# Patient Record
Sex: Male | Born: 1985 | Race: White | Hispanic: No | Smoking: Current every day smoker
Health system: Southern US, Community
[De-identification: ages and names within clinical notes are randomized; demographics above are authoritative.]

## PROBLEM LIST (undated history)

## (undated) DIAGNOSIS — I219 Acute myocardial infarction, unspecified: Secondary | ICD-10-CM

## (undated) HISTORY — PX: EYE SURGERY: SHX253

---

## 2014-10-12 ENCOUNTER — Encounter (HOSPITAL_COMMUNITY): Payer: Self-pay | Admitting: Emergency Medicine

## 2014-10-12 ENCOUNTER — Emergency Department (HOSPITAL_COMMUNITY)
Admission: EM | Admit: 2014-10-12 | Discharge: 2014-10-12 | Disposition: A | Discharge status: Home / Self Care | Payer: Self-pay | Attending: Emergency Medicine | Admitting: Emergency Medicine

## 2014-10-12 DIAGNOSIS — H109 Unspecified conjunctivitis: Secondary | ICD-10-CM | POA: Insufficient documentation

## 2014-10-12 DIAGNOSIS — Z720 Tobacco use: Secondary | ICD-10-CM | POA: Insufficient documentation

## 2014-10-12 DIAGNOSIS — Z7982 Long term (current) use of aspirin: Secondary | ICD-10-CM | POA: Insufficient documentation

## 2014-10-12 DIAGNOSIS — Z792 Long term (current) use of antibiotics: Secondary | ICD-10-CM | POA: Insufficient documentation

## 2014-10-12 DIAGNOSIS — I252 Old myocardial infarction: Secondary | ICD-10-CM | POA: Insufficient documentation

## 2014-10-12 DIAGNOSIS — Z79899 Other long term (current) drug therapy: Secondary | ICD-10-CM | POA: Insufficient documentation

## 2014-10-12 HISTORY — DX: Acute myocardial infarction, unspecified: I21.9

## 2014-10-12 MED ORDER — IBUPROFEN 800 MG PO TABS: 800.0000 mg | ORAL_TABLET | Freq: Three times a day (TID) | ORAL | Status: DC

## 2014-10-12 MED ORDER — TOBRAMYCIN 0.3 % OP SOLN
2.0000 [drp] | Freq: Once | OPHTHALMIC | Status: AC
  Administered 2014-10-12: 2 [drp] via OPHTHALMIC
  Filled 2014-10-12: qty 5

## 2014-10-12 MED ORDER — FLUORESCEIN SODIUM 1 MG OP STRP
1.0000 | ORAL_STRIP | Freq: Once | OPHTHALMIC | Status: AC
  Administered 2014-10-12: 1 via OPHTHALMIC
  Filled 2014-10-12: qty 1

## 2014-10-12 MED ORDER — HYDROCODONE-ACETAMINOPHEN 5-325 MG PO TABS: 1.0000 | ORAL_TABLET | ORAL | Status: DC | PRN

## 2014-10-12 MED ORDER — IBUPROFEN 800 MG PO TABS
800.0000 mg | ORAL_TABLET | Freq: Once | ORAL | Status: AC
  Administered 2014-10-12: 800 mg via ORAL
  Filled 2014-10-12: qty 1

## 2014-10-12 MED ORDER — ACETAMINOPHEN 500 MG PO TABS
500.0000 mg | ORAL_TABLET | Freq: Once | ORAL | Status: AC
  Administered 2014-10-12: 500 mg via ORAL
  Filled 2014-10-12: qty 1

## 2014-10-12 NOTE — ED Provider Notes (Signed)
CSN: [id number]     Arrival date & time 10/12/14  1330 History   First MD Initiated Contact with Patient 10/12/14 1555     Chief Complaint  Patient presents with  . Eye Pain     (Consider location/radiation/quality/duration/timing/severity/associated sxs/prior Treatment) HPI Comments: Patient is a 29 year old male who presents to the emergency department with a complaint of left eye pain. The patient states that he slept with a contact in his eye on last night, this morning he awakened to find that there was swelling of the eye, and soreness around the left eye. 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 The patient was able to get the contact out, but he continues to have pain. He particularly has pain when he has a change in the brightness of lites. He presents at this time for assistance with this discomfort in for evaluation of his eye.  Patient is a 29 y.o. male presenting with eye pain. The history is provided by the patient.  Eye Pain This is a new problem. Pertinent negatives include no abdominal pain, arthralgias, chest pain, coughing or neck pain.    Past Medical History  Diagnosis Date  . MI (myocardial infarction)     Dec 2015   Past Surgical History  Procedure Laterality Date  . Eye surgery     No family history on file. History  Substance Use Topics  . Smoking status: Current Every Day Smoker -- 0.20 packs/day    Types: Cigarettes  . Smokeless tobacco: Not on file  . Alcohol Use: No    Review of Systems  Constitutional: Negative for activity change.       All ROS Neg except as noted in HPI  HENT: Negative for nosebleeds.   Eyes: Positive for pain. Negative for photophobia and discharge.  Respiratory: Negative for cough, shortness of breath and wheezing.   Cardiovascular: Negative for chest pain and palpitations.  Gastrointestinal: Negative for abdominal pain and blood in stool.  Genitourinary: Negative for dysuria, frequency and hematuria.   Musculoskeletal: Negative for back pain, arthralgias and neck pain.  Skin: Negative.   Neurological: Negative for dizziness, seizures and speech difficulty.  Psychiatric/Behavioral: Negative for hallucinations and confusion.      Allergies  Review of patient's allergies indicates no known allergies.  Home Medications   Prior to Admission medications   Medication Sig Start Date End Date Taking? Authorizing Provider  aspirin EC 81 MG tablet Take 81 mg by mouth daily.   Yes Historical Provider, MD  levofloxacin (LEVAQUIN) 500 MG tablet Take 500 mg by mouth daily.   Yes Historical Provider, MD  metoprolol tartrate (LOPRESSOR) 25 MG tablet Take 25 mg by mouth 2 (two) times daily.   Yes Historical Provider, MD   BP 118/71 mmHg  Pulse 97  Temp(Src) 98.3 F (36.8 C) (Oral)  Resp 20  Ht 6' (1.829 m)  Wt 140 lb (63.504 kg)  BMI 18.98 kg/m2  SpO2 100% Physical Exam  Constitutional: He is oriented to person, place, and time. He appears well-developed and well-nourished.  Non-toxic appearance.  HENT:  Head: Normocephalic.  Right Ear: Tympanic membrane and external ear normal.  Left Ear: Tympanic membrane and external ear normal.  Eyes: EOM and lids are normal. Pupils are equal, round, and reactive to light.  There is soreness in the periorbital area of the left eye. The anterior chamber on the left is clear. The conjunctiva is injected. The bulbar conjunctiva is injected. The extra can  movement is intact. Limited ophthalmic examination due to pain, but no hemorrhage, or papillary edema appreciated. The globe is intact and firm.  Patient was examined with fluoroscopy seen and slit-lamp. No corneal abrasion appreciated. No foreign body appreciated.  Neck: Normal range of motion. Neck supple. Carotid bruit is not present.  Cardiovascular: Normal rate, regular rhythm, normal heart sounds, intact distal pulses and normal pulses.   Pulmonary/Chest: Breath sounds normal. No respiratory  distress.  Abdominal: Soft. Bowel sounds are normal. There is no tenderness. There is no guarding.  Musculoskeletal: Normal range of motion.  Lymphadenopathy:       Head (right side): No submandibular adenopathy present.       Head (left side): No submandibular adenopathy present.    He has no cervical adenopathy.  Neurological: He is alert and oriented to person, place, and time. He has normal strength. No cranial nerve deficit or sensory deficit.  Skin: Skin is warm and dry.  Psychiatric: He has a normal mood and affect. His speech is normal.  Nursing note and vitals reviewed.   ED Course  Procedures (including critical care time) Labs Review Labs Reviewed - No data to display  Imaging Review No results found.   EKG Interpretation None      MDM  The examination is consistent with conjunctivitis of the left eye. Patient is a been advised to white count surfaces and keep his distance from others. He has been treated with tobramycin ophthalmic drops, as well as ibuprofen and Norco for pain and headache. Patient is advised to use a cool compress to the eye as well as he will return if any changes, problems, or concerns.    Final diagnoses:  Conjunctivitis of left eye    *I have reviewed nursing notes, vital signs, and all appropriate lab and imaging results for this patient.[address] PA-C 10/12/14 1722  Rolland Porter, MD 10/16/14 [phone]

## 2014-10-12 NOTE — Discharge Instructions (Signed)
Please use a cool compress to your left eye. Please wash hands frequently. This condition is contagious, please wipe surfaces that you touch. Please keep your distance from others. Please use ibuprofen 3 times daily, and use 2 drops of your eye drops every 4 hours (tobramycin) for the next 5 days. Conjunctivitis Conjunctivitis is commonly called "pink eye." Conjunctivitis can be caused by bacterial or viral infection, allergies, or injuries. There is usually redness of the lining of the eye, itching, discomfort, and sometimes discharge. There may be deposits of matter along the eyelids. A viral infection usually causes a watery discharge, while a bacterial infection causes a yellowish, thick discharge. Pink eye is very contagious and spreads by direct contact. You may be given antibiotic eyedrops as part of your treatment. Before using your eye medicine, remove all drainage from the eye by washing gently with warm water and cotton balls. Continue to use the medication until you have awakened 2 mornings in a row without discharge from the eye. Do not rub your eye. This increases the irritation and helps spread infection. Use separate towels from other household members. Wash your hands with soap and water before and after touching your eyes. Use cold compresses to reduce pain and sunglasses to relieve irritation from light. Do not wear contact lenses or wear eye makeup until the infection is gone. SEEK MEDICAL CARE IF:   Your symptoms are not better after 3 days of treatment.  You have increased pain or trouble seeing.  The outer eyelids become very red or swollen. Document Released: 08/29/2004 Document Revised: 10/14/2011 Document Reviewed: 07/22/2005 [facility] Patient Information 2015 [place], [place]. This information is not intended to replace advice given to you by your health care provider. Make sure you discuss any questions you have with your health care provider.

## 2014-10-12 NOTE — ED Notes (Signed)
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

## 2014-12-18 ENCOUNTER — Observation Stay (HOSPITAL_COMMUNITY)
Admission: EM | Admit: 2014-12-18 | Discharge: 2014-12-19 | Disposition: A | Discharge status: Home / Self Care | Payer: Self-pay | Attending: Internal Medicine | Admitting: Internal Medicine

## 2014-12-18 ENCOUNTER — Encounter (HOSPITAL_COMMUNITY): Payer: Self-pay | Admitting: Emergency Medicine

## 2014-12-18 DIAGNOSIS — F17210 Nicotine dependence, cigarettes, uncomplicated: Secondary | ICD-10-CM | POA: Insufficient documentation

## 2014-12-18 DIAGNOSIS — J9601 Acute respiratory failure with hypoxia: Secondary | ICD-10-CM | POA: Diagnosis present

## 2014-12-18 DIAGNOSIS — R0902 Hypoxemia: Secondary | ICD-10-CM

## 2014-12-18 DIAGNOSIS — T50901A Poisoning by unspecified drugs, medicaments and biological substances, accidental (unintentional), initial encounter: Secondary | ICD-10-CM | POA: Diagnosis present

## 2014-12-18 DIAGNOSIS — T424X1A Poisoning by benzodiazepines, accidental (unintentional), initial encounter: Principal | ICD-10-CM | POA: Insufficient documentation

## 2014-12-18 DIAGNOSIS — F1910 Other psychoactive substance abuse, uncomplicated: Secondary | ICD-10-CM

## 2014-12-18 DIAGNOSIS — I252 Old myocardial infarction: Secondary | ICD-10-CM | POA: Insufficient documentation

## 2014-12-18 DIAGNOSIS — G9340 Encephalopathy, unspecified: Secondary | ICD-10-CM

## 2014-12-18 LAB — I-STAT VENOUS BLOOD GAS, ED
Acid-Base Excess: 2 mmol/L (ref 0.0–2.0)
Bicarbonate: 28.8 meq/L — ABNORMAL HIGH (ref 20.0–24.0)
O2 Saturation: 86 %
TCO2: 30 mmol/L (ref 0–100)
pCO2, Ven: 53.2 mmHg — ABNORMAL HIGH (ref 45.0–50.0)
pH, Ven: 7.341 — ABNORMAL HIGH (ref 7.250–7.300)
pO2, Ven: 56 mmHg — ABNORMAL HIGH (ref 30.0–45.0)

## 2014-12-18 LAB — CBC WITH DIFFERENTIAL/PLATELET
Basophils Absolute: 0 10*3/uL (ref 0.0–0.1)
Basophils Relative: 0 % (ref 0–1)
Eosinophils Absolute: 0.2 10*3/uL (ref 0.0–0.7)
Eosinophils Relative: 2 % (ref 0–5)
HCT: 46.7 % (ref 39.0–52.0)
Hemoglobin: 16.2 g/dL (ref 13.0–17.0)
Lymphocytes Relative: 22 % (ref 12–46)
Lymphs Abs: 2.5 10*3/uL (ref 0.7–4.0)
MCH: 32 pg (ref 26.0–34.0)
MCHC: 34.7 g/dL (ref 30.0–36.0)
MCV: 92.3 fL (ref 78.0–100.0)
Monocytes Absolute: 0.8 10*3/uL (ref 0.1–1.0)
Monocytes Relative: 7 % (ref 3–12)
Neutro Abs: 7.9 10*3/uL — ABNORMAL HIGH (ref 1.7–7.7)
Neutrophils Relative %: 69 % (ref 43–77)
Platelets: 196 10*3/uL (ref 150–400)
RBC: 5.06 MIL/uL (ref 4.22–5.81)
RDW: 14.8 % (ref 11.5–15.5)
WBC: 11.3 10*3/uL — ABNORMAL HIGH (ref 4.0–10.5)

## 2014-12-18 LAB — URINE MICROSCOPIC-ADD ON

## 2014-12-18 LAB — URINALYSIS, ROUTINE W REFLEX MICROSCOPIC
Glucose, UA: NEGATIVE mg/dL
Hgb urine dipstick: NEGATIVE
KETONES UR: 15 mg/dL — AB
Leukocytes, UA: NEGATIVE
NITRITE: NEGATIVE
Protein, ur: NEGATIVE mg/dL
SPECIFIC GRAVITY, URINE: 1.024 (ref 1.005–1.030)
Urobilinogen, UA: 0.2 mg/dL (ref 0.0–1.0)
pH: 5 (ref 5.0–8.0)

## 2014-12-18 LAB — COMPREHENSIVE METABOLIC PANEL
ALBUMIN: 3.8 g/dL (ref 3.5–5.0)
ALT: 13 U/L — ABNORMAL LOW (ref 17–63)
ANION GAP: 10 (ref 5–15)
AST: 18 U/L (ref 15–41)
Alkaline Phosphatase: 69 U/L (ref 38–126)
BUN: 11 mg/dL (ref 6–20)
CO2: 26 mmol/L (ref 22–32)
Calcium: 8.9 mg/dL (ref 8.9–10.3)
Chloride: 102 mmol/L (ref 101–111)
Creatinine, Ser: 1.02 mg/dL (ref 0.61–1.24)
GFR calc non Af Amer: >60 mL/min (ref >60)
Glucose, Bld: 85 mg/dL (ref 65–99)
Potassium: 3.5 mmol/L (ref 3.5–5.1)
Sodium: 138 mmol/L (ref 135–145)
Total Bilirubin: 0.8 mg/dL (ref 0.3–1.2)
Total Protein: 6.1 g/dL — ABNORMAL LOW (ref 6.5–8.1)

## 2014-12-18 LAB — RAPID URINE DRUG SCREEN, HOSP PERFORMED
Amphetamines: NOT DETECTED
Barbiturates: NOT DETECTED
Benzodiazepines: POSITIVE — AB
Cocaine: NOT DETECTED
Opiates: NOT DETECTED
Tetrahydrocannabinol: POSITIVE — AB

## 2014-12-18 LAB — I-STAT CG4 LACTIC ACID, ED
Lactic Acid, Venous: 0.64 mmol/L (ref 0.5–2.0)
Lactic Acid, Venous: 0.67 mmol/L (ref 0.5–2.0)

## 2014-12-18 MED ORDER — SODIUM CHLORIDE 0.9 % IV BOLUS (SEPSIS)
1000.0000 mL | Freq: Once | INTRAVENOUS | Status: AC
  Administered 2014-12-18: 1000 mL via INTRAVENOUS

## 2014-12-18 MED ORDER — SODIUM CHLORIDE 0.9 % IV SOLN
INTRAVENOUS | Status: DC
  Administered 2014-12-19: via INTRAVENOUS

## 2014-12-18 NOTE — ED Notes (Signed)
Douglas [name] [phone] Emergency Contact

## 2014-12-18 NOTE — ED Notes (Signed)
Xanax found on patient.  Changed story multiple times during triage.  States I took xanax, then states I took methadone.  Per EMS friends called them because he wasn't right.  Alert and answering questions but story continually changes.

## 2014-12-18 NOTE — ED Provider Notes (Signed)
CSN: [id number]     Arrival date & time 12/18/14  1924 History   First MD Initiated Contact with Patient 12/18/14 2007     Chief Complaint  Patient presents with  . Altered Mental Status   Douglas [name] is a 29 y.o. male with a past medical history significant for myocardial infarction who presents for altered mental status. The patient reports that he "drank some beer", took 2 pills of Xanax, and took 3 of methadone earlier this afternoon. The patient says that he has no other complaints specifically denying headache, vision changes, nausea, vomiting, constipation, diarrhea, dysuria, rashes, Chest pain, or shortness of breath. The patient denies any complaints. On initial history gathering, the patient is sleepy and drifted off to sleep during conversation.   (Consider location/radiation/quality/duration/timing/severity/associated sxs/prior Treatment) Patient is a 29 y.o. male presenting with Overdose. The history is provided by the patient. No language interpreter was used.  Drug Overdose This is a new problem. The problem occurs constantly. The problem has been unchanged. Associated symptoms include fatigue. Pertinent negatives include no abdominal pain, chest pain, chills, congestion, coughing, diaphoresis, fever, headaches, nausea, neck pain, vomiting or weakness. Associated symptoms comments: Lethargy . He has tried nothing for the symptoms. The treatment provided no relief.    Past Medical History  Diagnosis Date  . MI (myocardial infarction)     Dec 2015   Past Surgical History  Procedure Laterality Date  . Eye surgery     No family history on file. History  Substance Use Topics  . Smoking status: Current Every Day Smoker -- 0.20 packs/day    Types: Cigarettes  . Smokeless tobacco: Not on file  . Alcohol Use: No    Review of Systems  Constitutional: Positive for fatigue. Negative for fever, chills and diaphoresis.  HENT: Negative for congestion and rhinorrhea.   Eyes:  Negative for visual disturbance.  Respiratory: Negative for cough, choking, chest tightness, shortness of breath, wheezing and stridor.   Cardiovascular: Negative for chest pain and palpitations.  Gastrointestinal: Negative for nausea, vomiting, abdominal pain, diarrhea and constipation.  Musculoskeletal: Negative for back pain and neck pain.  Skin: Negative for wound.  Neurological: Negative for dizziness, weakness, light-headedness and headaches.  All other systems reviewed and are negative.     Allergies  Review of patient's allergies indicates no known allergies.  Home Medications   Prior to Admission medications   Medication Sig Start Date End Date Taking? Authorizing Provider  HYDROcodone-acetaminophen (NORCO/VICODIN) 5-325 MG per tablet Take 1-2 tablets by mouth every 4 (four) hours as needed. 10/12/14   Ivery QualeHobson Bryant, PA-C  ibuprofen (ADVIL,MOTRIN) 800 MG tablet Take 1 tablet (800 mg total) by mouth 3 (three) times daily. 10/12/14   Ivery QualeHobson Bryant, PA-C   BP 133/70 mmHg  Pulse 97  Temp(Src) 98.4 F (36.9 C)  Resp 18  Ht 6' (1.829 m)  Wt 140 lb (63.504 kg)  BMI 18.98 kg/m2  SpO2 100% Physical Exam  Constitutional: He is oriented to person, place, and time. He appears well-developed and well-nourished. He appears lethargic. No distress.  HENT:  Head: Normocephalic and atraumatic.  Mouth/Throat: No oropharyngeal exudate.  Eyes: Conjunctivae and EOM are normal. Pupils are equal, round, and reactive to light.  Neck: Normal range of motion.  Cardiovascular: Normal rate, regular rhythm, normal heart sounds and intact distal pulses.   No murmur heard. Pulmonary/Chest: Effort normal and breath sounds normal. No stridor. No respiratory distress. He has no wheezes. He exhibits no tenderness.  Abdominal: Soft.  There is no tenderness.  Musculoskeletal: Normal range of motion. He exhibits no tenderness.  Neurological: He is oriented to person, place, and time. He appears lethargic. He  is not disoriented. He displays normal reflexes. No cranial nerve deficit or sensory deficit. He exhibits normal muscle tone. Coordination normal. GCS eye subscore is 3. GCS verbal subscore is 5. GCS motor subscore is 6.  Skin: Skin is warm. He is not diaphoretic. No pallor.  Nursing note and vitals reviewed.   ED Course  Procedures (including critical care time) Labs Review Labs Reviewed  CBC WITH DIFFERENTIAL/PLATELET - Abnormal; Notable for the following:    WBC 11.3 (*)    Neutro Abs 7.9 (*)    All other components within normal limits  COMPREHENSIVE METABOLIC PANEL - Abnormal; Notable for the following:    Total Protein 6.1 (*)    ALT 13 (*)    All other components within normal limits  URINE RAPID DRUG SCREEN (HOSP PERFORMED) - Abnormal; Notable for the following:    Benzodiazepines POSITIVE (*)    Tetrahydrocannabinol POSITIVE (*)    All other components within normal limits  URINALYSIS, ROUTINE W REFLEX MICROSCOPIC - Abnormal; Notable for the following:    Color, Urine BROWN (*)    APPearance TURBID (*)    Bilirubin Urine SMALL (*)    Ketones, ur 15 (*)    All other components within normal limits  I-STAT VENOUS BLOOD GAS, ED - Abnormal; Notable for the following:    pH, Ven 7.341 (*)    pCO2, Ven 53.2 (*)    pO2, Ven 56.0 (*)    Bicarbonate 28.8 (*)    All other components within normal limits  URINE CULTURE  MRSA PCR SCREENING  URINE MICROSCOPIC-ADD ON  ETHANOL  ACETAMINOPHEN LEVEL  SALICYLATE LEVEL  D-DIMER, QUANTITATIVE  BASIC METABOLIC PANEL  CBC  I-STAT CG4 LACTIC ACID, ED  I-STAT CG4 LACTIC ACID, ED    Imaging Review No results found.   EKG Interpretation   Date/Time:  Sunday Dec 18 2014 19:35:32 EDT Ventricular Rate:  85 PR Interval:  127 QRS Duration: 95 QT Interval:  349 QTC Calculation: 415 R Axis:   79 Text Interpretation:  Sinus rhythm Borderline repolarization abnormality  No old tracing to compare Confirmed by Rhunette CroftNANAVATI, MD, Janey GentaANKIT  [phone]) on  12/18/2014 7:56:21 PM      MDM   Final diagnoses:  Hypoxemia   Douglas RiffleKyle [name] is a 29 y.o. male with a past medical history significant for myocardial infarction who presents for altered mental status and concern for accidental overdose. The patient reports that he drank P her, use Xanax, and took methadone earlier today. The patient is somnolent however, he is arousable with vigorous stimulation and yelling. When the patient is asleep, his oxygen saturations desaturated into the 80s on room air. After 2 L nasal cannula was placed, the patient maintained his oxygen saturation even while asleep. The patient had diagnostic laboratory workup to investigate for source of his lethargy and altered mental status.  The patient's initial alcohol level was negative, his lactic acid was normal, his CBC showed a mild leukocytosis of 11.3 but a normal hemoglobin. The patient's CMP was unremarkable and his UDS was positive for benzos and marijuana. The patient's urinalysis did not show evidence of infection.  The patient denied any complaints on review of systems. The patient denied any other symptoms aside from being sleepy.  The patient was observed for over 4 hours in the emergency department and  continued to have oxygen saturations when on room air. At this point, the patient was felt most appropriate to continue metabolizing in the hospital to ensure no hypoxia. Prior to admission, a chest x-ray was obtained and, in bedside review, there did not appear to be any focal abnormality.   The patient agreed with the plan of care, had no other problems, and was admitted in stable condition.   This patient was seen with Dr. Rhunette Croft, ED attending.     Theda Belfast, MD 12/19/14 1610  Derwood Kaplan, MD 12/27/14 [id number]

## 2014-12-18 NOTE — ED Notes (Signed)
Patient falls asleep rapidly.  Oxygen sat falls to low 80's.  O2 placed via [place] at 2L.  Provider aware.

## 2014-12-19 ENCOUNTER — Observation Stay (HOSPITAL_COMMUNITY): Payer: Self-pay

## 2014-12-19 ENCOUNTER — Encounter (HOSPITAL_COMMUNITY): Payer: Self-pay | Admitting: Internal Medicine

## 2014-12-19 DIAGNOSIS — J9601 Acute respiratory failure with hypoxia: Secondary | ICD-10-CM | POA: Diagnosis present

## 2014-12-19 DIAGNOSIS — T50901D Poisoning by unspecified drugs, medicaments and biological substances, accidental (unintentional), subsequent encounter: Secondary | ICD-10-CM

## 2014-12-19 DIAGNOSIS — T50901A Poisoning by unspecified drugs, medicaments and biological substances, accidental (unintentional), initial encounter: Secondary | ICD-10-CM | POA: Insufficient documentation

## 2014-12-19 LAB — CBC
HEMATOCRIT: 47.6 % (ref 39.0–52.0)
HEMOGLOBIN: 16 g/dL (ref 13.0–17.0)
MCH: 31.3 pg (ref 26.0–34.0)
MCHC: 33.6 g/dL (ref 30.0–36.0)
MCV: 93.2 fL (ref 78.0–100.0)
PLATELETS: 190 10*3/uL (ref 150–400)
RBC: 5.11 MIL/uL (ref 4.22–5.81)
RDW: 14.9 % (ref 11.5–15.5)
WBC: 8.6 10*3/uL (ref 4.0–10.5)

## 2014-12-19 LAB — BASIC METABOLIC PANEL
Anion gap: 7 (ref 5–15)
BUN: 9 mg/dL (ref 6–20)
CHLORIDE: 105 mmol/L (ref 101–111)
CO2: 26 mmol/L (ref 22–32)
Calcium: 8.3 mg/dL — ABNORMAL LOW (ref 8.9–10.3)
Creatinine, Ser: 0.89 mg/dL (ref 0.61–1.24)
GFR calc Af Amer: >60 mL/min (ref >60)
GFR calc non Af Amer: >60 mL/min (ref >60)
Glucose, Bld: 82 mg/dL (ref 65–99)
Potassium: 3.8 mmol/L (ref 3.5–5.1)
SODIUM: 138 mmol/L (ref 135–145)

## 2014-12-19 LAB — MRSA PCR SCREENING: MRSA by PCR: NEGATIVE

## 2014-12-19 LAB — D-DIMER, QUANTITATIVE (NOT AT ARMC)

## 2014-12-19 LAB — SALICYLATE LEVEL: Salicylate Lvl: <4 mg/dL (ref 2.8–30.0)

## 2014-12-19 LAB — ETHANOL: Alcohol, Ethyl (B): <5 mg/dL

## 2014-12-19 LAB — ACETAMINOPHEN LEVEL: Acetaminophen (Tylenol), Serum: <10 ug/mL — ABNORMAL LOW (ref 10–30)

## 2014-12-19 MED ORDER — ENOXAPARIN SODIUM 40 MG/0.4ML ~~LOC~~ SOLN
40.0000 mg | Freq: Every day | SUBCUTANEOUS | Status: DC
  Administered 2014-12-19: 40 mg via SUBCUTANEOUS
  Filled 2014-12-19: qty 0.4

## 2014-12-19 MED ORDER — SODIUM CHLORIDE 0.9 % IV SOLN
INTRAVENOUS | Status: DC
  Administered 2014-12-19 (×2): via INTRAVENOUS

## 2014-12-19 MED ORDER — ONDANSETRON HCL 4 MG/2ML IJ SOLN: 4.0000 mg | Freq: Four times a day (QID) | INTRAMUSCULAR | Status: DC | PRN

## 2014-12-19 MED ORDER — ONDANSETRON HCL 4 MG PO TABS: 4.0000 mg | ORAL_TABLET | Freq: Four times a day (QID) | ORAL | Status: DC | PRN

## 2014-12-19 NOTE — Discharge Instructions (Signed)
Alcohol Intoxication  Alcohol intoxication occurs when the amount of alcohol that a person has consumed impairs his or her ability to mentally and physically function. Alcohol directly impairs the normal chemical activity of the brain. Drinking large amounts of alcohol can lead to changes in mental function and behavior, and it can cause many physical effects that can be harmful.   Alcohol intoxication can range in severity from mild to very severe. Various factors can affect the level of intoxication that occurs, such as the person's age, gender, weight, frequency of alcohol consumption, and the presence of other medical conditions (such as diabetes, seizures, or heart conditions). Dangerous levels of alcohol intoxication may occur when people drink large amounts of alcohol in a short period (binge drinking). Alcohol can also be especially dangerous when combined with certain prescription medicines or "recreational" drugs.  SIGNS AND SYMPTOMS  Some common signs and symptoms of mild alcohol intoxication include:  · Loss of coordination.  · Changes in mood and behavior.  · Impaired judgment.  · Slurred speech.  As alcohol intoxication progresses to more severe levels, other signs and symptoms will appear. These may include:  · Vomiting.  · Confusion and impaired memory.  · Slowed breathing.  · Seizures.  · Loss of consciousness.  DIAGNOSIS   Your health care provider will take a medical history and perform a physical exam. You will be asked about the amount and type of alcohol you have consumed. Blood tests will be done to measure the concentration of alcohol in your blood. In many places, your blood alcohol level must be lower than 80 mg/dL (0.08%) to legally drive. However, many dangerous effects of alcohol can occur at much lower levels.   TREATMENT   People with alcohol intoxication often do not require treatment. Most of the effects of alcohol intoxication are temporary, and they go away as the alcohol naturally  leaves the body. Your health care provider will monitor your condition until you are stable enough to go home. Fluids are sometimes given through an IV access tube to help prevent dehydration.   HOME CARE INSTRUCTIONS  · Do not drive after drinking alcohol.  · Stay hydrated. Drink enough water and fluids to keep your urine clear or pale yellow. Avoid caffeine.    · Only take over-the-counter or prescription medicines as directed by your health care provider.    SEEK MEDICAL CARE IF:   · You have persistent vomiting.    · You do not feel better after a few days.  · You have frequent alcohol intoxication. Your health care provider can help determine if you should see a substance use treatment counselor.  SEEK IMMEDIATE MEDICAL CARE IF:   · You become shaky or tremble when you try to stop drinking.    · You shake uncontrollably (seizure).    · You throw up (vomit) blood. This may be bright red or may look like black coffee grounds.    · You have blood in your stool. This may be bright red or may appear as a black, tarry, bad smelling stool.    · You become lightheaded or faint.    MAKE SURE YOU:   · Understand these instructions.  · Will watch your condition.  · Will get help right away if you are not doing well or get worse.  Document Released: 05/01/2005 Document Revised: 03/24/2013 Document Reviewed: 12/25/2012  ExitCare® Patient Information ©2015 [employer]. This information is not intended to replace advice given to you by your health care provider. Make sure   you discuss any questions you have with your health care provider.

## 2014-12-19 NOTE — Progress Notes (Signed)
Nutrition Brief Note  Patient identified on the Malnutrition Screening Tool (MST) Report  Wt Readings from Last 15 Encounters:  12/19/14 158 lb 4.6 oz (71.8 kg)  10/12/14 140 lb (63.504 kg)    Body mass index is 21.46 kg/(m^2). Patient meets criteria for Normal Weight based on current BMI.   Current diet order is NPO. Pt denies any unintentional weight loss and reports eating well with a good appetite PTA. He is very hungry and awaiting MD approval to be able to eat. Labs and medications reviewed.   No nutrition interventions warranted at this time. If nutrition issues arise, please consult RD.   Ian Malkineanne Barnett RD, LDN Inpatient Clinical Dietitian Pager: [phone] After Hours Pager: [phone]

## 2014-12-19 NOTE — Discharge Summary (Addendum)
DISCHARGE SUMMARY  Douglas [name]  MR#: [id number]  DOB:17-Feb-1986  Date of Admission: 12/18/2014  Date of Discharge: 12/19/2014  Attending Physician:Douglas [name]  [name]'s PCP:Douglas [name]  Consults: none  Disposition: D/C home   Follow-up Appts:     Follow-up Information    Schedule an appointment as soon as possible for a visit with Douglas [name]    .   Why:  Call the clinic to make an appointment for a routine health check.     Contact information:   [address] [place] [phone]     Discharge Diagnoses: Accidental overdose Acute respiratory failure with hypoxemia   Initial presentation: 29 yo male with a history of MI in 07/2014 who was brought to the ED due to decreased level of consciousness. His friend reported he had drank Beer, and took Xanax. He was sluggish and lethargic and unable to give a history.   In the ED he was found to have hypoxia with an O2 sat on RA down to 86%, and was placed on 2 liters NCO2 with improvement. His UDS was positive for Benzodiazepines and THC.   Hospital Course: The [name] was admitted for observation after presenting to the emergency room and hypoxic respiratory failure and obtunded.  The etiology of this was the concomitant use of high-volume alcohol and Xanax.  With supportive care the [name] rapidly improved.  At the time my evaluation on 12/19/2014 the [name] was alert oriented and interactive.  He denies any complaints whatsoever.  He vehemently denied any desire to harm himself in any way.  He denied any suicidal or homicidal thoughts or ideation.  He admitted that what he did was "stupid" and that he would never do it again.  He tells me that he only drinks once or twice a month at most and usually not to excess.  He does not feel that he has an alcohol problem.  He denies ever having used benzodiazepines before and states that this was  simply a one-time bad decision.  He has convinced me he is not a danger to himself and that he is also not likely an alcoholic nor a habitual substance abuser.  Even if he is simply hiding his abuse well he is not interested in rehabilitation and clearly is competent therefore we have little option but to release him.  He is medically stable.    Medication List    STOP taking these medications        HYDROcodone-acetaminophen 5-325 MG per tablet  Commonly known as:  NORCO/VICODIN     ibuprofen 800 MG tablet  Commonly known as:  ADVIL,MOTRIN        Day of Discharge BP 124/66 mmHg  Pulse 94  Temp(Src) 97.6 F (36.4 C) (Oral)  Resp 16  Ht 6' (1.829 m)  Wt 71.8 kg (158 lb 4.6 oz)  BMI 21.46 kg/m2  SpO2 97%  Physical Exam: General: Douglas acute respiratory distress Lungs: Clear to auscultation bilaterally without wheezes or crackles Cardiovascular: Regular rate and rhythm without murmur gallop or rub normal S1 and S2 Abdomen: Nontender, nondistended, soft, bowel sounds positive, Douglas rebound, Douglas ascites, Douglas appreciable mass Extremities: Douglas significant cyanosis, clubbing, or edema bilateral lower extremities  Basic Metabolic Panel:  Recent Labs Lab 12/18/14 2054 12/19/14 0300  NA 138 138  K 3.5 3.8  CL 102 105  CO2 26 26  GLUCOSE 85 82  BUN 11 9  CREATININE 1.02 0.89  CALCIUM 8.9 8.3*    Liver Function Tests:  Recent Labs Lab 12/18/14 2054  AST 18  ALT 13*  ALKPHOS 69  BILITOT 0.8  PROT 6.1*  ALBUMIN 3.8   CBC:  Recent Labs Lab 12/18/14 2054 12/19/14 0300  WBC 11.3* 8.6  NEUTROABS 7.9*  --   HGB 16.2 16.0  HCT 46.7 47.6  MCV 92.3 93.2  PLT 196 190    Recent Results (from the past 240 hour(s))  MRSA PCR Screening     Status: None   Collection Time: 12/19/14 12:33 AM  Result Value Ref Range Status   MRSA by PCR NEGATIVE NEGATIVE Final    Comment:        The GeneXpert MRSA Assay (FDA approved for NASAL specimens only), is one component of  a comprehensive MRSA colonization surveillance program. It is not intended to diagnose MRSA infection nor to guide or monitor treatment for MRSA infections.       Time spent in discharge (includes decision making & examination of pt): 30 minutes  12/19/2014, 12:09 PM   Douglas BloodJeffrey [name]. Douglas Levett, MD Douglas [name] Office  [phone] Pager [phone]  On-Call/Text Page:      Douglas [name]      password Douglas [name]

## 2014-12-19 NOTE — H&P (Signed)
[facility] Admission History and Physical       Douglas RiffleKyle Willhelm WJX:[id number] DOB: [birth year] DOA: 12/18/2014  Referring physician: EDP:   PCP: No PCP Per Patient  Specialists:   Chief Complaint: Overdose  HPI: Douglas [name] is a 29 y.o. male with a history of An MI in 07/2014 who was brought to the ED due to decreased level of consciousness this afternoon.   His friend reports that he [place] and drank [employer], and took Xanax, and Methadone.   He was sluggish and lethargic and unable to give a history.   In the ED, he was found to have hypoxia with an O2 sat on RA down to 86%, and was placed on 2 liters NCO2 with improvement.   His UDS was positive for Benzodiazepines and  THC.   He was referred for medical observation.      Review of Systems: Unable to Obtain from the Patient    Past Medical History  Diagnosis Date  . MI (myocardial infarction)     Dec 2015     Past Surgical History  Procedure Laterality Date  . Eye surgery        Prior to Admission medications   Medication Sig Start Date End Date Taking? Authorizing Provider  HYDROcodone-acetaminophen (NORCO/VICODIN) 5-325 MG per tablet Take 1-2 tablets by mouth every 4 (four) hours as needed. Patient not taking: Reported on 12/18/2014 10/12/14   Ivery QualeHobson Bryant, PA-C  ibuprofen (ADVIL,MOTRIN) 800 MG tablet Take 1 tablet (800 mg total) by mouth 3 (three) times daily. Patient not taking: Reported on 12/18/2014 10/12/14   Ivery QualeHobson Bryant, PA-C     No Known Allergies   Social History:  reports that he has been smoking Cigarettes.  He has been smoking about 0.20 packs per day. He does not have any smokeless tobacco history on file. He reports that he does not drink alcohol or use illicit drugs.     Family History:    Unable to Obtain   Physical Exam:  GEN:  Obtunded Thin Well Developed  29 y.o. Caucasian male examined and in no acute distress;  Filed Vitals:   12/18/14 2230 12/18/14 2300  12/19/14 0000 12/19/14 0016  BP: 119/70 138/82 134/88   Pulse: 73 80 80   Temp:    98.8 F (37.1 C)  TempSrc:    Oral  Resp: 13 13 14    Height:      Weight:      SpO2: 95% 92% 92%    Blood pressure 134/88, pulse 80, temperature 98.8 F (37.1 C), temperature source Oral, resp. rate 14, height 6' (1.829 m), weight 63.504 kg (140 lb), SpO2 92 %. PSYCH: He is alert and oriented x1; Arouses to Loud Verbal, but drifts back to Sleep HEENT: Normocephalic and Atraumatic, Mucous membranes pink; PERRLA; EOM intact; Fundi:  Benign;  No scleral icterus, Nares: Patent, Oropharynx: Clear, Fair Dentition,    Neck:  FROM, No Cervical Lymphadenopathy nor Thyromegaly or Carotid Bruit; No JVD; Breasts:: Not examined CHEST WALL: No tenderness CHEST: Normal respiration, clear to auscultation bilaterally HEART: Regular rate and rhythm; no murmurs rubs or gallops BACK: No kyphosis or scoliosis; No CVA tenderness ABDOMEN: Positive Bowel Sounds, Scaphoid, Soft Non-Tender, No Rebound or Guarding; No Masses, No Organomegaly. Rectal Exam: Not done EXTREMITIES: No Cyanosis, Clubbing, or Edema; No Ulcerations. Genitalia: not examined PULSES: 2+ and symmetric SKIN: Normal hydration no rash or ulceration CNS:  Alert and Oriented x 1, Able to Move All  4 Exts Vascular: pulses palpable throughout    Labs on Admission:  Basic Metabolic Panel:  Recent Labs Lab 12/18/14 2054  NA 138  K 3.5  CL 102  CO2 26  GLUCOSE 85  BUN 11  CREATININE 1.02  CALCIUM 8.9   Liver Function Tests:  Recent Labs Lab 12/18/14 2054  AST 18  ALT 13*  ALKPHOS 69  BILITOT 0.8  PROT 6.1*  ALBUMIN 3.8   No results for input(s): LIPASE, AMYLASE in the last 168 hours. No results for input(s): AMMONIA in the last 168 hours. CBC:  Recent Labs Lab 12/18/14 2054  WBC 11.3*  NEUTROABS 7.9*  HGB 16.2  HCT 46.7  MCV 92.3  PLT 196   Cardiac Enzymes: No results for input(s): CKTOTAL, CKMB, CKMBINDEX, TROPONINI in the last  168 hours.  BNP (last 3 results) No results for input(s): BNP in the last 8760 hours.  ProBNP (last 3 results) No results for input(s): PROBNP in the last 8760 hours.  CBG: No results for input(s): GLUCAP in the last 168 hours.  Radiological Exams on Admission: No results found.     Assessment/Plan:   29 y.o. male with  Principal Problem:   1.    Accidental overdose   Stepdown Monitoring of Cardiac, Neurologic and Pulmonary Fxn   Neuro Checks   Check ETOH level, Tylenol Level, and Salicylate Levels   Psych Eval when Alert RE: Substance Abuse    Active Problems:   2.   Acute respiratory failure with hypoxemia- due to Sedation from meds ingested, a VBG was done.      Monitor O2 sats Continuously   O2 PRN   BiPAP or Intubation if needed   Check D-Dimer   Check CXR in AM to Rule out developing PNA from Aspiration           3.   DVT Prophylaxis   Lovenox          Code Status:     FULL CODE     Family Communication:    No Family Present    Disposition Plan:   Observation Status        Time spent:  5360 Minutes      Ron ParkerJENKINS,Chaney Ingram C [facility] Pager [phone]   If 7AM -7PM Please Contact the Day Rounding Team MD for [facility]  If 7PM-7AM, Please Contact Night-Floor Coverage  [email or web address] Password [email or web address] 12/19/2014, 12:22 AM     ADDENDUM:   Patient was seen and examined on 12/19/2014

## 2014-12-20 LAB — URINE CULTURE

## 2014-12-22 ENCOUNTER — Emergency Department (HOSPITAL_COMMUNITY)
Admission: EM | Admit: 2014-12-22 | Discharge: 2014-12-22 | Disposition: A | Discharge status: Home / Self Care | Payer: Self-pay | Attending: Emergency Medicine | Admitting: Emergency Medicine

## 2014-12-22 ENCOUNTER — Encounter (HOSPITAL_COMMUNITY): Payer: Self-pay

## 2014-12-22 DIAGNOSIS — L02415 Cutaneous abscess of right lower limb: Secondary | ICD-10-CM | POA: Insufficient documentation

## 2014-12-22 DIAGNOSIS — I252 Old myocardial infarction: Secondary | ICD-10-CM | POA: Insufficient documentation

## 2014-12-22 DIAGNOSIS — L0291 Cutaneous abscess, unspecified: Secondary | ICD-10-CM

## 2014-12-22 DIAGNOSIS — Z720 Tobacco use: Secondary | ICD-10-CM | POA: Insufficient documentation

## 2014-12-22 MED ORDER — SULFAMETHOXAZOLE-TRIMETHOPRIM 200-40 MG/5ML PO SUSP: 20.0000 mL | Freq: Once | ORAL | Status: DC

## 2014-12-22 MED ORDER — SULFAMETHOXAZOLE-TRIMETHOPRIM 200-40 MG/5ML PO SUSP
20.0000 mL | Freq: Once | ORAL | Status: DC
Start: 2014-12-22 — End: 2014-12-22

## 2014-12-22 MED ORDER — LIDOCAINE-EPINEPHRINE (PF) 1 %-1:200000 IJ SOLN
10.0000 mL | Freq: Once | INTRAMUSCULAR | Status: AC
  Administered 2014-12-22: 10 mL
  Filled 2014-12-22: qty 10

## 2014-12-22 MED ORDER — SULFAMETHOXAZOLE-TRIMETHOPRIM 800-160 MG PO TABS: 2.0000 | ORAL_TABLET | Freq: Two times a day (BID) | ORAL | Status: AC

## 2014-12-22 MED ORDER — HYDROCODONE-ACETAMINOPHEN 5-325 MG PO TABS
2.0000 | ORAL_TABLET | Freq: Once | ORAL | Status: AC
  Administered 2014-12-22: 2 via ORAL
  Filled 2014-12-22: qty 2

## 2014-12-22 MED ORDER — SULFAMETHOXAZOLE-TRIMETHOPRIM 800-160 MG PO TABS
2.0000 | ORAL_TABLET | Freq: Once | ORAL | Status: AC
  Administered 2014-12-22: 2 via ORAL
  Filled 2014-12-22: qty 2

## 2014-12-22 MED ORDER — SULFAMETHOXAZOLE-TRIMETHOPRIM 200-40 MG/5ML PO SUSP
ORAL | Status: AC
  Filled 2014-12-22: qty 160

## 2014-12-22 NOTE — ED Notes (Signed)
MD at bedside. 

## 2014-12-22 NOTE — ED Provider Notes (Signed)
CSN: [id number]     Arrival date & time 12/22/14  0744 History   First MD Initiated Contact with Patient 12/22/14 [phone]     Chief Complaint  Patient presents with  . Abscess      HPI  Patient presents with a red swollen area on his right lower leg since yesterday. States he is squeezing clear fluid from it. He alleges it could be a bite, however he has not seen anything on his skin to know for sure. He states he's had "boils" in the past that he has had to have  drained.  Past Medical History  Diagnosis Date  . MI (myocardial infarction)     Dec 2015   Past Surgical History  Procedure Laterality Date  . Eye surgery     No family history on file. History  Substance Use Topics  . Smoking status: Current Every Day Smoker -- 0.20 packs/day    Types: Cigarettes  . Smokeless tobacco: Not on file  . Alcohol Use: No    Review of Systems  Constitutional: Negative for fever, chills, diaphoresis, appetite change and fatigue.  HENT: Negative for mouth sores, sore throat and trouble swallowing.   Eyes: Negative for visual disturbance.  Respiratory: Negative for cough, chest tightness, shortness of breath and wheezing.   Cardiovascular: Negative for chest pain.  Gastrointestinal: Negative for nausea, vomiting, abdominal pain, diarrhea and abdominal distention.  Endocrine: Negative for polydipsia, polyphagia and polyuria.  Genitourinary: Negative for dysuria, frequency and hematuria.  Musculoskeletal: Negative for gait problem.  Skin: Positive for wound. Negative for color change, pallor and rash.  Neurological: Negative for dizziness, syncope, light-headedness and headaches.  Hematological: Does not bruise/bleed easily.  Psychiatric/Behavioral: Negative for behavioral problems and confusion.      Allergies  Review of patient's allergies indicates no known allergies.  Home Medications   Prior to Admission medications   Medication Sig Start Date End Date Taking? Authorizing  Provider  sulfamethoxazole-trimethoprim (BACTRIM DS,SEPTRA DS) 800-160 MG per tablet Take 2 tablets by mouth 2 (two) times daily. 12/22/14 12/29/14  Rolland PorterMark Janelle Culton, MD   BP 185/106 mmHg  Pulse 130  Temp(Src) 98.4 F (36.9 C) (Oral)  Resp 20  Ht 6' (1.829 m)  Wt 140 lb (63.504 kg)  BMI 18.98 kg/m2  SpO2 98% Physical Exam  Constitutional: He is oriented to person, place, and time. He appears well-developed and well-nourished. No distress.  HENT:  Head: Normocephalic.  Eyes: Conjunctivae are normal. Pupils are equal, round, and reactive to light. No scleral icterus.  Neck: Normal range of motion. Neck supple. No thyromegaly present.  Cardiovascular: Normal rate and regular rhythm.  Exam reveals no gallop and no friction rub.   No murmur heard. Pulmonary/Chest: Effort normal and breath sounds normal. No respiratory distress. He has no wheezes. He has no rales.  Abdominal: Soft. Bowel sounds are normal. He exhibits no distension. There is no tenderness. There is no rebound.  Musculoskeletal: Normal range of motion.       Legs: Neurological: He is alert and oriented to person, place, and time.  Skin: Skin is warm and dry. No rash noted.  Psychiatric: He has a normal mood and affect. His behavior is normal.    ED Course  Procedures (including critical care time) Labs Review Labs Reviewed - No data to display  Imaging Review No results found.   EKG Interpretation None     INCISION AND DRAINAGE Performed by: Claudean KindsJAMES, Demarr Kluever JOSEPH Consent: Verbal consent obtained. Risks and benefits:  risks, benefits and alternatives were discussed Type: abscess  Body area: right lateral lower leg  Anesthesia: local infiltration  Incision was made with a scalpel.  Local anesthetic: lidocaine 1% with epinephrine  Anesthetic total: 3 ml  Complexity: complex Blunt dissection to break up loculations  Drainage: purulent  Drainage amount: 3  Packing material: 1/4 in iodoform gauze  Patient  tolerance: Patient tolerated the procedure well with no immediate complications.     MDM   Final diagnoses:  Abscess    Dressed with Ace wrap. Discharged home. Careful instructions given.    Rolland PorterMark Tarissa Kerin, MD 12/22/14 [phone]

## 2014-12-22 NOTE — Discharge Instructions (Signed)
Keep ace wrap on today and tomorrow.   Soak the wound and remove the 3 inches of gauze in 48 hours. After gauze is out, keep wound covered with gauze and ace. Re check here with any redness extending up the leg, above the knee.  Keep your leg elevated today, and intermittently apply ice pack over ace wrap for 20 minutes. Tylenol or Ibuprofen as needed.  Abscess An abscess is an infected area that contains a collection of pus and debris.It can occur in almost any part of the body. An abscess is also known as a furuncle or boil. CAUSES  An abscess occurs when tissue gets infected. This can occur from blockage of oil or sweat glands, infection of hair follicles, or a minor injury to the skin. As the body tries to fight the infection, pus collects in the area and creates pressure under the skin. This pressure causes pain. People with weakened immune systems have difficulty fighting infections and get certain abscesses more often.  SYMPTOMS Usually an abscess develops on the skin and becomes a painful mass that is red, warm, and tender. If the abscess forms under the skin, you may feel a moveable soft area under the skin. Some abscesses break open (rupture) on their own, but most will continue to get worse without care. The infection can spread deeper into the body and eventually into the bloodstream, causing you to feel ill.  DIAGNOSIS  Your caregiver will take your medical history and perform a physical exam. A sample of fluid may also be taken from the abscess to determine what is causing your infection. TREATMENT  Your caregiver may prescribe antibiotic medicines to fight the infection. However, taking antibiotics alone usually does not cure an abscess. Your caregiver may need to make a small cut (incision) in the abscess to drain the pus. In some cases, gauze is packed into the abscess to reduce pain and to continue draining the area. HOME CARE INSTRUCTIONS   Only take over-the-counter or  prescription medicines for pain, discomfort, or fever as directed by your caregiver.  If you were prescribed antibiotics, take them as directed. Finish them even if you start to feel better.  If gauze is used, follow your caregiver's directions for changing the gauze.  To avoid spreading the infection:  Keep your draining abscess covered with a bandage.  Wash your hands well.  Do not share personal care items, towels, or whirlpools with others.  Avoid skin contact with others.  Keep your skin and clothes clean around the abscess.  Keep all follow-up appointments as directed by your caregiver. SEEK MEDICAL CARE IF:   You have increased pain, swelling, redness, fluid drainage, or bleeding.  You have muscle aches, chills, or a general ill feeling.  You have a fever. MAKE SURE YOU:   Understand these instructions.  Will watch your condition.  Will get help right away if you are not doing well or get worse. Document Released: 05/01/2005 Document Revised: 01/21/2012 Document Reviewed: 10/04/2011 [facility] Patient Information 2015 [employer] [place]. This information is not intended to replace advice given to you by your health care provider. Make sure you discuss any questions you have with your health care provider.

## 2014-12-22 NOTE — ED Notes (Signed)
Pt reports abscess to r lower leg since yesterday.  Area red, swollen, and tender to touch.

## 2014-12-22 NOTE — ED Notes (Signed)
MD at bedside doing I and D.

## 2016-12-05 IMAGING — CR DG CHEST 1V PORT
1 series · 1 of 1 positions shown · non-contrast
Comparison: None.

CLINICAL DATA: Altered mental status.

EXAM:
PORTABLE CHEST - 1 VIEW

[AP]
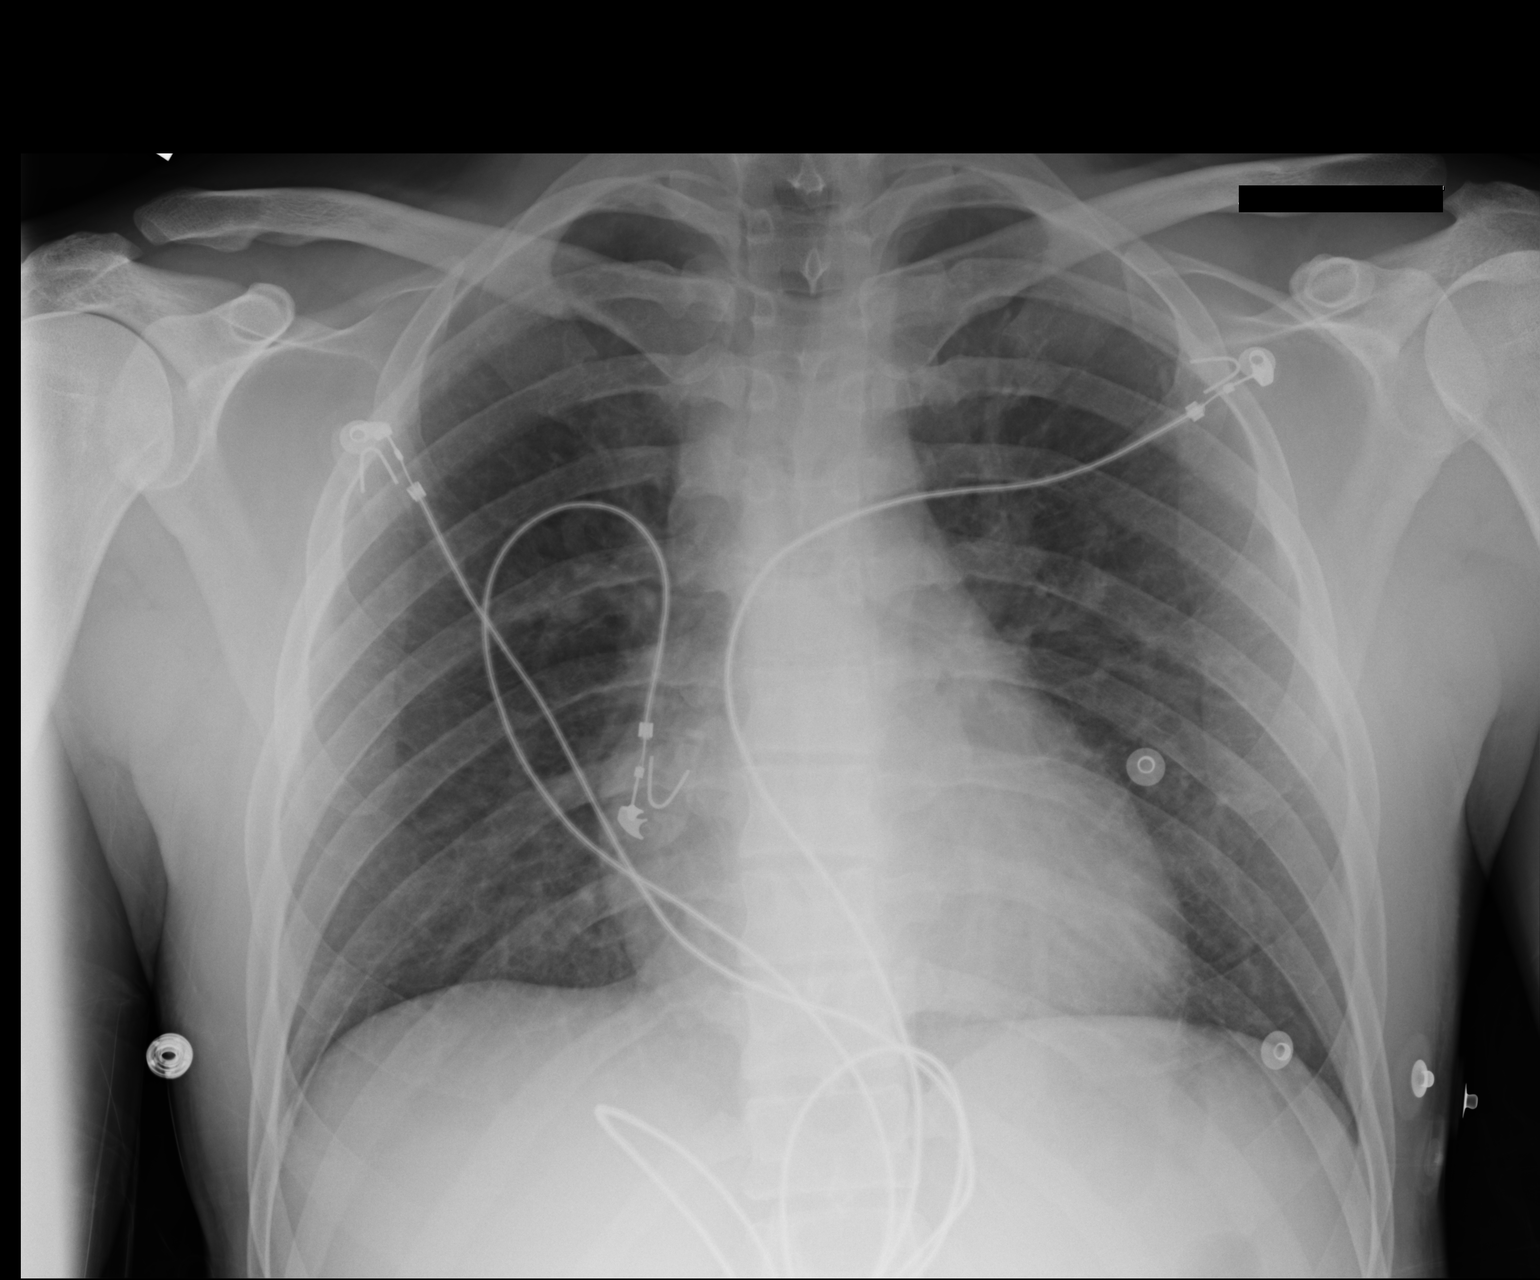

[1 of 1 positions shown; findings below may reference images not displayed]

FINDINGS: The cardiomediastinal contours are normal. The lungs are clear.
Pulmonary vasculature is normal. No consolidation, pleural effusion,
or pneumothorax. No acute osseous abnormalities are seen.
IMPRESSION: No acute pulmonary process.
